# Patient Record
Sex: Male | Born: 1985 | Race: White | Hispanic: No | Marital: Single | Smoking: Current every day smoker
Health system: Southern US, Community
[De-identification: ages and names within clinical notes are randomized; demographics above are authoritative.]

---

## 2009-11-11 ENCOUNTER — Emergency Department (HOSPITAL_COMMUNITY): Admission: EM | Admit: 2009-11-11 | Discharge: 2009-11-11 | Payer: Self-pay | Admitting: Emergency Medicine

## 2009-12-16 ENCOUNTER — Emergency Department (HOSPITAL_COMMUNITY): Admission: EM | Admit: 2009-12-16 | Discharge: 2009-12-16 | Payer: Self-pay | Admitting: Family Medicine

## 2011-02-19 LAB — GC/CHLAMYDIA PROBE AMP, GENITAL

## 2011-02-19 LAB — RPR: RPR Ser Ql: NONREACTIVE

## 2014-03-26 ENCOUNTER — Ambulatory Visit: Payer: Self-pay | Admitting: Family Medicine

## 2014-03-26 VITALS — BP 114/72 | HR 88 | Temp 98.1°F | Resp 16 | Ht 64.0 in | Wt 178.4 lb

## 2014-03-26 DIAGNOSIS — I891 Lymphangitis: Secondary | ICD-10-CM

## 2014-03-26 DIAGNOSIS — N492 Inflammatory disorders of scrotum: Secondary | ICD-10-CM

## 2014-03-26 DIAGNOSIS — N498 Inflammatory disorders of other specified male genital organs: Secondary | ICD-10-CM

## 2014-03-26 DIAGNOSIS — Z7251 High risk heterosexual behavior: Secondary | ICD-10-CM

## 2014-03-26 MED ORDER — DOXYCYCLINE HYCLATE 100 MG PO TABS: 100.0000 mg | ORAL_TABLET | Freq: Two times a day (BID) | ORAL | Status: DC

## 2014-03-26 MED ORDER — CEFTRIAXONE SODIUM 1 G IJ SOLR
1.0000 g | Freq: Once | INTRAMUSCULAR | Status: AC
  Administered 2014-03-26: 1 g via INTRAMUSCULAR

## 2014-03-26 NOTE — Patient Instructions (Signed)
Warm soaks or hot compresses to swollen area, start antibiotic as discussed. Recheck with Dr. Neva SeatGreene in next 2 days. Return to the clinic or go to the nearest emergency room if any of your symptoms worsen or new symptoms occur. You should receive a call or letter about your lab results within the next week to 10 days.   Cellulitis Cellulitis is an infection of the skin and the tissue beneath it. The infected area is usually red and tender. Cellulitis occurs most often in the arms and lower legs.  CAUSES  Cellulitis is caused by bacteria that enter the skin through cracks or cuts in the skin. The most common types of bacteria that cause cellulitis are Staphylococcus and Streptococcus. SYMPTOMS   Redness and warmth.  Swelling.  Tenderness or pain.  Fever. DIAGNOSIS  Your caregiver can usually determine what is wrong based on a physical exam. Blood tests may also be done. TREATMENT  Treatment usually involves taking an antibiotic medicine. HOME CARE INSTRUCTIONS   Take your antibiotics as directed. Finish them even if you start to feel better.  Keep the infected arm or leg elevated to reduce swelling.  Apply a warm cloth to the affected area up to 4 times per day to relieve pain.  Only take over-the-counter or prescription medicines for pain, discomfort, or fever as directed by your caregiver.  Keep all follow-up appointments as directed by your caregiver. SEEK MEDICAL CARE IF:   You notice red streaks coming from the infected area.  Your red area gets larger or turns dark in color.  Your bone or joint underneath the infected area becomes painful after the skin has healed.  Your infection returns in the same area or another area.  You notice a swollen bump in the infected area.  You develop new symptoms. SEEK IMMEDIATE MEDICAL CARE IF:   You have a fever.  You feel very sleepy.  You develop vomiting or diarrhea.  You have a general ill feeling (malaise) with muscle  aches and pains. MAKE SURE YOU:   Understand these instructions.  Will watch your condition.  Will get help right away if you are not doing well or get worse. Document Released: 08/30/2005 Document Revised: 05/21/2012 Document Reviewed: 02/05/2012 [facility] Patient Information 2014 [place], [place].

## 2014-03-26 NOTE — Progress Notes (Addendum)
This chart was scribed for Kenneth IncorporatedJeffrey R. Neva SeatGreene, MD by Beverly MilchJ Harrison Collins, ED Scribe. This patient was seen in room 1 and the patient's care was started at 2:49 PM.  Subjective:    Patient ID: Kenneth Gaines, male    DOB: 01/06/1986, 28 y.o.   MRN: [id number]  Chief Complaint  Patient presents with  . Ingrown hair in private area x 4 days    HPI Kenneth Gaines is a 28 y.o. male who c/o an ingrown hair that he noticed 5 days ago. He states it's becoming increasingly swollen and sore to the touch. Pt states he punctured the area with a needle last night. He states he's applied neosporin. He reports that he regularly shaves all the way down to the skin once a week with a straight razor. He reports a h/o similar ingrown hairs but never this large. Pt denies fevers, chills, and dysuria. He reports some mild testicular and shaft pain and some spot on the top of his penis for a couple years. Pt confirms a h/o gonorrhea and chlymidia. He states he has been with around 10 sexual partners in the recent past but has been with his current partner for a year. He is only sexually active with females. He has never worn a condom with his current partner. He states he hasn't been tested for STIs in a few years. 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  No primary provider on file.  There are no active problems to display for this patient.  History reviewed. No pertinent past medical history. History reviewed. No pertinent past surgical history. No Known Allergies Prior to Admission medications   Not on File   History   Social History  . Marital Status: Single    Spouse Name: N/A    Number of Children: N/A  . Years of Education: N/A   Occupational History  . Not on file.   Social History Main Topics  . Smoking status: Current Every Day Smoker  . Smokeless tobacco: Not on file  . Alcohol Use: No  . Drug Use: No  . Sexual Activity: Yes   Other Topics Concern  . Not on file    Social History Narrative  . No narrative on file      Review of Systems  Constitutional: Negative for fever and chills.  Genitourinary: Positive for penile pain and testicular pain. Negative for dysuria.  Skin: Positive for rash.       Objective:   Physical Exam  Nursing note and vitals reviewed. Constitutional: He is oriented to person, place, and time. He appears well-developed and well-nourished.  HENT:  Head: Normocephalic and atraumatic.  Neck: Neck supple.  Pulmonary/Chest: Effort normal.  Genitourinary:  Firm nodular area at the base of the penis extending to the midline of the scrotum with erythema and a pustular area, no apparent fluctuance of the soft tissue in the area, testicles non tender.  Lymphadenopathy:       Right: Inguinal adenopathy present.       Left: Inguinal adenopathy present.  Neurological: He is alert and oriented to person, place, and time. No cranial nerve deficit.  Psychiatric: He has a normal mood and affect. His behavior is normal.    Filed Vitals:   03/26/14 1352  BP: 114/72  Pulse: 88  Temp: 98.1 F (36.7 C)  TempSrc: Oral  Resp: 16  Height: 5\' 4"  (1.626 m)  Weight: 178 lb 6.4 oz (80.922 kg)  SpO2: 97%       Assessment & Plan:   Kenneth EddyRichard Hook is a 28 y.o. male Cellulitis of scrotum, Lymphangitis - Plan: Wound culture, doxycycline (VIBRA-TABS) 100 MG tablet, cefTRIAXone (ROCEPHIN) injection 1 g  -appears to be spreading with lymphangitis. 2nd MD exam performed. cx obtained, start Rocephin, and doxycycline for MRSA coverage. Recheck in 48 hours. Sx care prior to that time.   Problems related to high-risk sexual behavior - Plan: GC/Chlamydia Probe Amp - discussed HIV and RPR - plans to have done at health dept. Small area of dry skin at distal penile shaft appears more irritated/abraded than infectious - advised condom use with lubricant and if not improved in next 2 weeks - recheck.  Sooner if worse.        Meds ordered this  encounter  Medications  . doxycycline (VIBRA-TABS) 100 MG tablet    Sig: Take 1 tablet (100 mg total) by mouth 2 (two) times daily.    Dispense:  20 tablet    Refill:  0  . cefTRIAXone (ROCEPHIN) injection 1 g    Sig:     Order Specific Question:  Antibiotic Indication:    Answer:  Cellulitis   Patient Instructions  Warm soaks or hot compresses to swollen area, start antibiotic as discussed. Recheck with Dr. Neva Gaines in next 2 days. Return to the clinic or go to the nearest emergency room if any of your symptoms worsen or new symptoms occur. You should receive a call or letter about your lab results within the next week to 10 days.   Cellulitis Cellulitis is an infection of the skin and the tissue beneath it. The infected area is usually red and tender. Cellulitis occurs most often in the arms and lower legs.  CAUSES  Cellulitis is caused by bacteria that enter the skin through cracks or cuts in the skin. The most common types of bacteria that cause cellulitis are Staphylococcus and Streptococcus. SYMPTOMS   Redness and warmth.  Swelling.  Tenderness or pain.  Fever. DIAGNOSIS  Your caregiver can usually determine what is wrong based on a physical exam. Blood tests may also be done. TREATMENT  Treatment usually involves taking an antibiotic medicine. HOME CARE INSTRUCTIONS   Take your antibiotics as directed. Finish them even if you start to feel better.  Keep the infected arm or leg elevated to reduce swelling.  Apply a warm cloth to the affected area up to 4 times per day to relieve pain.  Only take over-the-counter or prescription medicines for pain, discomfort, or fever as directed by your caregiver.  Keep all follow-up appointments as directed by your caregiver. SEEK MEDICAL CARE IF:   You notice red streaks coming from the infected area.  Your red area gets larger or turns dark in color.  Your bone or joint underneath the infected area becomes painful after the  skin has healed.  Your infection returns in the same area or another area.  You notice a swollen bump in the infected area.  You develop new symptoms. SEEK IMMEDIATE MEDICAL CARE IF:   You have a fever.  You feel very sleepy.  You develop vomiting or diarrhea.  You have a general ill feeling (malaise) with muscle aches and pains. MAKE SURE YOU:   Understand these instructions.  Will watch your condition.  Will get help right away if you are not doing well or get worse. Document Released: 08/30/2005 Document Revised: 05/21/2012 Document Reviewed: 02/05/2012 [facility] Patient Information 2014 [place]ExitCare, [place]LL[place].

## 2014-03-27 LAB — GC/CHLAMYDIA PROBE AMP
CT Probe RNA: NEGATIVE
GC Probe RNA: NEGATIVE

## 2014-03-29 LAB — WOUND CULTURE
GRAM STAIN: NONE SEEN
Gram Stain: NONE SEEN
ORGANISM ID, BACTERIA: NO GROWTH

## 2015-04-08 ENCOUNTER — Ambulatory Visit (INDEPENDENT_AMBULATORY_CARE_PROVIDER_SITE_OTHER): Payer: Self-pay | Admitting: Emergency Medicine

## 2015-04-08 VITALS — BP 114/70 | HR 84 | Temp 98.2°F | Resp 18 | Ht 64.5 in | Wt 173.6 lb

## 2015-04-08 DIAGNOSIS — Z021 Encounter for pre-employment examination: Secondary | ICD-10-CM

## 2015-04-08 DIAGNOSIS — Z029 Encounter for administrative examinations, unspecified: Secondary | ICD-10-CM

## 2015-04-08 NOTE — Progress Notes (Signed)
[facility] [address], [place] [address] [phone]  Date:  04/08/2015   Name:  Kenneth Gaines   DOB:  10/04/1986   MRN:  [id number]  PCP:  No primary care provider on file.    Chief Complaint: Employment Physical   History of Present Illness:  Kenneth Gaines is a 29 y.o. very pleasant male patient who presents with the following:  DOT  There are no active problems to display for this patient.   History reviewed. No pertinent past medical history.  History reviewed. No pertinent past surgical history.  History  Substance Use Topics  . Smoking status: Current Every Day Smoker  . Smokeless tobacco: Not on file  . Alcohol Use: No    Family History  Problem Relation Age of Onset  . Diabetes Mother   . Hypertension Mother   . Hypertension Brother     No Known Allergies  Medication list has been reviewed and updated.  No current outpatient prescriptions on file prior to visit.   No current facility-administered medications on file prior to visit.    Review of Systems:  Review of Systems  Constitutional: Negative for fever, chills and fatigue.  HENT: Negative for congestion, ear pain, hearing loss, postnasal drip, rhinorrhea and sinus pressure.   Eyes: Negative for discharge and redness.  Respiratory: Negative for cough, shortness of breath and wheezing.   Cardiovascular: Negative for chest pain and leg swelling.  Gastrointestinal: Negative for nausea, vomiting, abdominal pain, constipation and blood in stool.  Genitourinary: Negative for dysuria, urgency and frequency.  Musculoskeletal: Negative for neck stiffness.  Skin: Negative for rash.  Neurological: Negative for seizures, weakness and headaches.   Physical Examination: Filed Vitals:   04/08/15 1439  BP: 114/70  Pulse: 84  Temp: 98.2 F (36.8 C)  Resp: 18   Filed Vitals:   04/08/15 1439  Height: 5' 4.5" (1.638 m)  Weight: 173 lb 9.6 oz (78.744 kg)   Body mass index  is 29.35 kg/(m^2). Ideal Body Weight: Weight in (lb) to have BMI = 25: 147.6  GEN: WDWN, NAD, Non-toxic, A & O x 3 HEENT: Atraumatic, Normocephalic. Neck supple. No masses, No LAD. Ears and Nose: No external deformity. CV: RRR, No M/G/R. No JVD. No thrill. No extra heart sounds. PULM: CTA B, no wheezes, crackles, rhonchi. No retractions. No resp. distress. No accessory muscle use. ABD: S, NT, ND, +BS. No rebound. No HSM. EXTR: No c/c/e NEURO Normal gait.  PSYCH: Normally interactive. Conversant. Not depressed or anxious appearing.  Calm demeanor.    Assessment and Plan: DOT  Signed Phillips OdorJeffery Fisher Hargadon, MD

## 2017-07-03 ENCOUNTER — Encounter (HOSPITAL_COMMUNITY): Payer: Self-pay | Admitting: Emergency Medicine

## 2017-07-03 ENCOUNTER — Ambulatory Visit (INDEPENDENT_AMBULATORY_CARE_PROVIDER_SITE_OTHER): Payer: Self-pay

## 2017-07-03 ENCOUNTER — Ambulatory Visit (HOSPITAL_COMMUNITY)
Admission: EM | Admit: 2017-07-03 | Discharge: 2017-07-03 | Disposition: A | Discharge status: Home / Self Care | Payer: Self-pay | Attending: Emergency Medicine | Admitting: Emergency Medicine

## 2017-07-03 DIAGNOSIS — Y30XXXA Falling, jumping or pushed from a high place, undetermined intent, initial encounter: Secondary | ICD-10-CM

## 2017-07-03 DIAGNOSIS — M25571 Pain in right ankle and joints of right foot: Secondary | ICD-10-CM

## 2017-07-03 DIAGNOSIS — S82891A Other fracture of right lower leg, initial encounter for closed fracture: Secondary | ICD-10-CM

## 2017-07-03 MED ORDER — IBUPROFEN 600 MG PO TABS: 600.0000 mg | ORAL_TABLET | Freq: Four times a day (QID) | ORAL | 0 refills | Status: AC | PRN

## 2017-07-03 NOTE — Discharge Instructions (Signed)
600 mg ibuprofen with 1 g of Tylenol 3-4 times a day. Ice this for 20 minutes at a time. Elevate as much as possible. Do not weight bear on this ankle until you're seen by orthopedics. Call and make an appointment for as soon as you can.

## 2017-07-03 NOTE — ED Triage Notes (Signed)
Pt was jumping out of the back of his truck with tools in his hand when his pants got caught on something and he rolled his right ankle.  Pt has pain and swelling in the ankle.

## 2017-07-03 NOTE — Progress Notes (Signed)
Orthopedic Tech Progress Note Patient Details:  Kenneth EddyRichard Gaines 08/10/1986 [id number]  Ortho Devices Type of Ortho Device: Ace wrap, Crutches, Post (short leg) splint, Stirrup splint Ortho Device/Splint Location: rle Ortho Device/Splint Interventions: Application   Kenneth Gaines 07/03/2017, 3:13 PM

## 2017-07-03 NOTE — ED Provider Notes (Signed)
HPI  SUBJECTIVE:  Kenneth Gaines is a 31 y.o. male who presents with right lateral ankle pain, swelling after jumping off his truck approximately 3-1/2 feet. He states he landed on his foot and rolled his ankle outwards. States that he was wearing 60 pounds of tools as well. He reports sharp pain with walking and burning pain while sitting. He states that his whole ankle hurts. He reports numbness in his toes which have since resolved. No erythema, foot, proximal lower extremity, knee pain. No limitation of motion. He is unable to weight-bear immediately after. states that he can't take 4 steps here in the clinic. He has a history of right ankle sprain. He tried using a cane without relief. Symptoms are worse with walking, inversion/eversion. He is a smoker. No history of diabetes, hypertension. PMD: None.  History reviewed. No pertinent past medical history.  History reviewed. No pertinent surgical history.  Family History  Problem Relation Age of Onset  . Diabetes Mother   . Hypertension Mother   . Hypertension Brother     Social History  Substance Use Topics  . Smoking status: Current Every Day Smoker    Packs/day: 2.00    Types: Cigarettes  . Smokeless tobacco: Never Used  . Alcohol use Yes    No current facility-administered medications for this encounter.   Current Outpatient Prescriptions:  .  ibuprofen (ADVIL,MOTRIN) 600 MG tablet, Take 1 tablet (600 mg total) by mouth every 6 (six) hours as needed., Disp: 30 tablet, Rfl: 0  No Known Allergies   ROS  As noted in HPI.   Physical Exam  BP 129/82 (BP Location: Left Arm)   Pulse 96   Temp 98.9 F (37.2 C) (Oral)   SpO2 96%   Constitutional: Well developed, well nourished, no acute distress Eyes:  EOMI, conjunctiva normal bilaterally HENT: Normocephalic, atraumatic,mucus membranes moist Respiratory: Normal inspiratory effort Cardiovascular: Normal rate GI: nondistended skin: No rash, skin intact Musculoskeletal:  Large lateral soft tissue swelling right ankle, positive early bruising, Proximal fibula NT, Distal fibula tender, Medial malleolus NT,  Deltoid ligament medially NT ,  Lateral ligaments  tender, ATFL laterally tender, posterior tablofibular ligament laterally tender  , calcaneofibular ligament laterally  tender,  Achilles mildly tender, no soft tissue defects, Thompson test negative, calcaneus  NT,  Proximal 5th metatarsal NT, Midfoot NT, distal NVI with baseline sensation / motor to foot intact.Marland Kitchen. no pain with dorsiflexion/plantar flexion. Pain with inversion/eversion  Pt  not able to bear weight in dept.  Neurologic: Alert & oriented x 3, no focal neuro deficits Psychiatric: Speech and behavior appropriate   ED Course   Medications - No data to display  Orders Placed This Encounter  Procedures  . DG Ankle Complete Right    Standing Status:   Standing    Number of Occurrences:   1    Order Specific Question:   Reason for Exam (SYMPTOM  OR DIAGNOSIS REQUIRED)    Answer:   fall  . Apply other splint    Standing Status:   Standing    Number of Occurrences:   1    Order Specific Question:   Laterality    Answer:   Right    Order Specific Question:   Splint type    Answer:   posterior ankle and stirrup splint  . Crutches    Standing Status:   Standing    Number of Occurrences:   1    No results found for this or any previous  visit (from the past 24 hour(s)). Dg Ankle Complete Right  Result Date: 07/03/2017 CLINICAL DATA:  31 year old male status post fall from truck with ankle pain and swelling. EXAM: RIGHT ANKLE - COMPLETE 3+ VIEW COMPARISON:  None. FINDINGS: Anterior and lateral greater than medial soft tissue swelling about the right ankle. Multiple chronic appearing ossific fragments at both the medial and lateral malleolus. On the lateral view there are small more age indeterminate appearing ossific fragments in proximity to the posterior aspect of the lateral malleolus. No other  acute fracture of the distal tibia or fibula. Mortise joint alignment preserved. Talar dome intact. Calcaneus intact. Visible right foot osseous structures appear normal. IMPRESSION: Probable acute on chronic tiny avulsion fracture at the lateral malleolus. With underlying fairly extensive chronic posttraumatic ossific fragments at the medial and lateral malleolus. Electronically Signed   By: Odessa FlemingH  Hall M.D.   On: 07/03/2017 14:16    ED Clinical Impression  Closed avulsion fracture of right ankle, initial encounter   ED Assessment/Plan  Patient declined prescription of narcotics and he declined to get narcotics here.   Reviewed imaging independently.  Probable acute on chronic tiny avulsion fracture at the lateral malleolus. 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 See radiology report for full details.  Will have a posterior and ankle stirrup splint applied, crutches, nonweightbearing until follow-up with orthopedics in several days. [facility],  on-[facility].  Discussed imaging, MDM, plan and followup with patient. Discussed sn/sx that should prompt return to the ED. Patient agrees with plan.   Meds ordered this encounter  Medications  . ibuprofen (ADVIL,MOTRIN) 600 MG tablet    Sig: Take 1 tablet (600 mg total) by mouth every 6 (six) hours as needed.    Dispense:  30 tablet    Refill:  0    *This clinic note was created using Scientist, clinical (histocompatibility and immunogenetics)Dragon dictation software. Therefore, there may be occasional mistakes despite careful proofreading.  ?   Domenick GongMortenson, Marylee Belzer, MD 07/03/17 1719

## 2017-07-03 NOTE — ED Notes (Signed)
Ortho tech at bedside 

## 2017-11-16 ENCOUNTER — Encounter: Payer: Self-pay | Admitting: Urgent Care

## 2017-11-19 ENCOUNTER — Ambulatory Visit: Payer: Self-pay | Admitting: Physician Assistant

## 2017-11-19 ENCOUNTER — Other Ambulatory Visit: Payer: Self-pay

## 2017-11-19 VITALS — BP 137/85 | HR 97 | Temp 98.4°F | Resp 16 | Ht 65.35 in | Wt 190.0 lb

## 2017-11-19 DIAGNOSIS — Z0289 Encounter for other administrative examinations: Secondary | ICD-10-CM

## 2017-11-19 NOTE — Progress Notes (Signed)
[facility] [address], [place] [phone]  Date:  11/19/2017   Name:  Kenneth Gaines   DOB:  08/01/1986   MRN:  [id number]  PCP:  Patient, No Pcp Per    History of Present Illness:  Kenneth Gaines is a 31 y.o. male patient who presents to PCP with  Chief Complaint  Patient presents with  . Annual Exam     No complaints or concerns at this time.  There are no active problems to display for this patient.   No past medical history on file.  No past surgical history on file.  Social History   Tobacco Use  . Smoking status: Current Every Day Smoker    Packs/day: 2.00    Types: Cigarettes  . Smokeless tobacco: Never Used  Substance Use Topics  . Alcohol use: Yes  . Drug use: No    Family History  Problem Relation Age of Onset  . Diabetes Mother   . Hypertension Mother   . Hypertension Brother     No Known Allergies  Medication list has been reviewed and updated.  Current Outpatient Medications on File Prior to Visit  Medication Sig Dispense Refill  . ibuprofen (ADVIL,MOTRIN) 600 MG tablet Take 1 tablet (600 mg total) by mouth every 6 (six) hours as needed. (Patient not taking: Reported on 11/19/2017) 30 tablet 0   No current facility-administered medications on file prior to visit.     ROS ROS otherwise unremarkable unless listed above.  Physical Examination: BP 137/85   Pulse 97   Temp 98.4 F (36.9 C) (Oral)   Resp 16   Ht 5' 5.35" (1.66 m)   Wt 190 lb (86.2 kg)   SpO2 97%   BMI 31.28 kg/m  Ideal Body Weight: Weight in (lb) to have BMI = 25: 151.6  Physical Exam  Constitutional: He is oriented to person, place, and time. He appears well-developed and well-nourished. No distress.  HENT:  Head: Normocephalic and atraumatic.  Right Ear: Tympanic membrane, external ear and ear canal normal.  Left Ear: Tympanic membrane, external ear and ear canal normal.  Eyes: Conjunctivae and EOM are normal. Pupils are equal, round,  and reactive to light.  Cardiovascular: Normal rate and regular rhythm. Exam reveals no friction rub.  No murmur heard. Pulmonary/Chest: Effort normal. No respiratory distress. He has no wheezes.  Abdominal: Soft. Bowel sounds are normal. He exhibits no distension and no mass. There is no tenderness. Hernia confirmed negative in the right inguinal area and confirmed negative in the left inguinal area.  Musculoskeletal: Normal range of motion. He exhibits no edema or tenderness.  Neurological: He is alert and oriented to person, place, and time. He displays normal reflexes.  Skin: Skin is warm and dry. He is not diaphoretic.  Psychiatric: He has a normal mood and affect. His behavior is normal.    Assessment and Plan: Kenneth Gaines is a 31 y.o. male who is here today for cc of  Chief Complaint  Patient presents with  . Annual Exam   Encounter for examination required by Department of Transportation (DOT)  Trena PlattStephanie English, PA-C [facility] 12/29/20185:42 PM

## 2017-11-19 NOTE — Patient Instructions (Addendum)
Also contact [phone] now   --please contact me back if you would like to try any of these medicines, but we will need to follow up for additional refills Bupropion tablets (Depression/Mood Disorders) What is this medicine? BUPROPION (byoo PROE pee on) is used to treat depression. This medicine may be used for other purposes; ask your health care provider or pharmacist if you have questions. COMMON BRAND NAME(S): Wellbutrin What should I tell my health care provider before I take this medicine? They need to know if you have any of these conditions: -an eating disorder, such as anorexia or bulimia -bipolar disorder or psychosis -diabetes or high blood sugar, treated with medication -glaucoma -heart disease, previous heart attack, or irregular heart beat -head injury or brain tumor -high blood pressure -kidney or liver disease -seizures -suicidal thoughts or a previous suicide attempt -Tourette's syndrome -weight loss -an unusual or allergic reaction to bupropion, other medicines, foods, dyes, or preservatives -breast-feeding -pregnant or trying to become pregnant How should I use this medicine? Take this medicine by mouth with a glass of water. Follow the directions on the prescription label. You can take it with or without food. If it upsets your stomach, take it with food. Take your medicine at regular intervals. Do not take your medicine more often than directed. Do not stop taking this medicine suddenly except upon the advice of your doctor. Stopping this medicine too quickly may cause serious side effects or your condition may worsen. A special MedGuide will be given to you by the pharmacist with each prescription and refill. Be sure to read this information carefully each time. Talk to your pediatrician regarding the use of this medicine in children. Special care may be needed. Overdosage: If you think you have taken too much of this medicine contact a poison control center or  emergency room at once. NOTE: This medicine is only for you. Do not share this medicine with others. What if I miss a dose? If you miss a dose, take it as soon as you can. If it is less than four hours to your next dose, take only that dose and skip the missed dose. Do not take double or extra doses. What may interact with this medicine? Do not take this medicine with any of the following medications: -linezolid -MAOIs like Azilect, Carbex, Eldepryl, Marplan, Nardil, and Parnate -methylene blue (injected into a vein) -other medicines that contain bupropion like Zyban This medicine may also interact with the following medications: -alcohol -certain medicines for anxiety or sleep -certain medicines for blood pressure like metoprolol, propranolol -certain medicines for depression or psychotic disturbances -certain medicines for HIV or AIDS like efavirenz, lopinavir, nelfinavir, ritonavir -certain medicines for irregular heart beat like propafenone, flecainide -certain medicines for Parkinson's disease like amantadine, levodopa -certain medicines for seizures like carbamazepine, phenytoin, phenobarbital -cimetidine -clopidogrel -cyclophosphamide -digoxin -furazolidone -isoniazid -nicotine -orphenadrine -procarbazine -steroid medicines like prednisone or cortisone -stimulant medicines for attention disorders, weight loss, or to stay awake -tamoxifen -theophylline -thiotepa -ticlopidine -tramadol -warfarin This list may not describe all possible interactions. Give your health care provider a list of all the medicines, herbs, non-prescription drugs, or dietary supplements you use. Also tell them if you smoke, drink alcohol, or use illegal drugs. Some items may interact with your medicine. What should I watch for while using this medicine? Tell your doctor if your symptoms do not get better or if they get worse. Visit your doctor or health care professional for regular checks on your  progress.  Because it may take several weeks to see the full effects of this medicine, it is important to continue your treatment as prescribed by your doctor. Patients and their families should watch out for new or worsening thoughts of suicide or depression. Also watch out for sudden changes in feelings such as feeling anxious, agitated, panicky, irritable, hostile, aggressive, impulsive, severely restless, overly excited and hyperactive, or not being able to sleep. If this happens, especially at the beginning of treatment or after a change in dose, call your health care professional. Avoid alcoholic drinks while taking this medicine. Drinking excessive alcoholic beverages, using sleeping or anxiety medicines, or quickly stopping the use of these agents while taking this medicine may increase your risk for a seizure. Do not drive or use heavy machinery until you know how this medicine affects you. This medicine can impair your ability to perform these tasks. Do not take this medicine close to bedtime. It may prevent you from sleeping. Your mouth may get dry. Chewing sugarless gum or sucking hard candy, and drinking plenty of water may help. Contact your doctor if the problem does not go away or is severe. What side effects may I notice from receiving this medicine? Side effects that you should report to your doctor or health care professional as soon as possible: -allergic reactions like skin rash, itching or hives, swelling of the face, lips, or tongue -breathing problems -changes in vision -confusion -elevated mood, decreased need for sleep, racing thoughts, impulsive behavior -fast or irregular heartbeat -hallucinations, loss of contact with reality -increased blood pressure -redness, blistering, peeling or loosening of the skin, including inside the mouth -seizures -suicidal thoughts or other mood changes -unusually weak or tired -vomiting Side effects that usually do not require medical  attention (report to your doctor or health care professional if they continue or are bothersome): -constipation -headache -loss of appetite -nausea -tremors -weight loss This list may not describe all possible side effects. Call your doctor for medical advice about side effects. You may report side effects to FDA at 1-800-FDA-1088. Where should I keep my medicine? Keep out of the reach of children. Store at room temperature between 20 and 25 degrees C (68 and 77 degrees F), away from direct sunlight and moisture. Keep tightly closed. Throw away any unused medicine after the expiration date. NOTE: This sheet is a summary. It may not cover all possible information. If you have questions about this medicine, talk to your doctor, pharmacist, or health care provider.  2018 Elsevier/Gold Standard (2016-05-12 13:44:21)   Varenicline oral tablets What is this medicine? VARENICLINE (var EN i kleen) is used to help people quit smoking. It can reduce the symptoms caused by stopping smoking. It is used with a patient support program recommended by your physician. This medicine may be used for other purposes; ask your health care provider or pharmacist if you have questions. COMMON BRAND NAME(S): Chantix What should I tell my health care provider before I take this medicine? They need to know if you have any of these conditions: -bipolar disorder, depression, schizophrenia or other mental illness -heart disease -if you often drink alcohol -kidney disease -peripheral vascular disease -seizures -stroke -suicidal thoughts, plans, or attempt; a previous suicide attempt by you or a family member -an unusual or allergic reaction to varenicline, other medicines, foods, dyes, or preservatives -pregnant or trying to get pregnant -breast-feeding How should I use this medicine? Take this medicine by mouth after eating. Take with a full glass of water.  Follow the directions on the prescription label. Take  your doses at regular intervals. Do not take your medicine more often than directed. There are 3 ways you can use this medicine to help you quit smoking; talk to your health care professional to decide which plan is right for you: 1) you can choose a quit date and start this medicine 1 week before the quit date, or, 2) you can start taking this medicine before you choose a quit date, and then pick a quit date between day 8 and 35 days of treatment, or, 3) if you are not sure that you are able or willing to quit smoking right away, start taking this medicine and slowly decrease the amount you smoke as directed by your health care professional with the goal of being cigarette-free by week 12 of treatment. Stick to your plan; ask about support groups or other ways to help you remain cigarette-free. If you are motivated to quit smoking and did not succeed during a previous attempt with this medicine for reasons other than side effects, or if you returned to smoking after this treatment, speak with your health care professional about whether another course of this medicine may be right for you. A special MedGuide will be given to you by the pharmacist with each prescription and refill. Be sure to read this information carefully each time. Talk to your pediatrician regarding the use of this medicine in children. This medicine is not approved for use in children. Overdosage: If you think you have taken too much of this medicine contact a poison control center or emergency room at once. NOTE: This medicine is only for you. Do not share this medicine with others. What if I miss a dose? If you miss a dose, take it as soon as you can. If it is almost time for your next dose, take only that dose. Do not take double or extra doses. What may interact with this medicine? -alcohol or any product that contains alcohol -insulin -other stop smoking aids -theophylline -warfarin This list may not describe all possible  interactions. Give your health care provider a list of all the medicines, herbs, non-prescription drugs, or dietary supplements you use. Also tell them if you smoke, drink alcohol, or use illegal drugs. Some items may interact with your medicine. What should I watch for while using this medicine? Visit your doctor or health care professional for regular check ups. Ask for ongoing advice and encouragement from your doctor or healthcare professional, friends, and family to help you quit. If you smoke while on this medication, quit again Your mouth may get dry. Chewing sugarless gum or sucking hard candy, and drinking plenty of water may help. Contact your doctor if the problem does not go away or is severe. You may get drowsy or dizzy. Do not drive, use machinery, or do anything that needs mental alertness until you know how this medicine affects you. Do not stand or sit up quickly, especially if you are an older patient. This reduces the risk of dizzy or fainting spells. Sleepwalking can happen during treatment with this medicine, and can sometimes lead to behavior that is harmful to you, other people, or property. Stop taking this medicine and tell your doctor if you start sleepwalking or have other unusual sleep-related activity. Decrease the amount of alcoholic beverages that you drink during treatment with this medicine until you know if this medicine affects your ability to tolerate alcohol. Some people have experienced increased drunkenness (intoxication),  unusual or sometimes aggressive behavior, or no memory of things that have happened (amnesia) during treatment with this medicine. The use of this medicine may increase the chance of suicidal thoughts or actions. Pay special attention to how you are responding while on this medicine. Any worsening of mood, or thoughts of suicide or dying should be reported to your health care professional right away. What side effects may I notice from receiving this  medicine? Side effects that you should report to your doctor or health care professional as soon as possible: -allergic reactions like skin rash, itching or hives, swelling of the face, lips, tongue, or throat -acting aggressive, being angry or violent, or acting on dangerous impulses -breathing problems -changes in vision -chest pain or chest tightness -confusion, trouble speaking or understanding -new or worsening depression, anxiety, or panic attacks -extreme increase in activity and talking (mania) -fast, irregular heartbeat -feeling faint or lightheaded, falls -fever -pain in legs when walking -problems with balance, talking, walking -redness, blistering, peeling or loosening of the skin, including inside the mouth -ringing in ears -seeing or hearing things that aren't there (hallucinations) -seizures -sleepwalking -sudden numbness or weakness of the face, arm or leg -thoughts about suicide or dying, or attempts to commit suicide -trouble passing urine or change in the amount of urine -unusual bleeding or bruising -unusually weak or tired Side effects that usually do not require medical attention (report to your doctor or health care professional if they continue or are bothersome): -constipation -headache -nausea, vomiting -strange dreams -stomach gas -trouble sleeping This list may not describe all possible side effects. Call your doctor for medical advice about side effects. You may report side effects to FDA at 1-800-FDA-1088. Where should I keep my medicine? Keep out of the reach of children. Store at room temperature between 15 and 30 degrees C (59 and 86 degrees F). Throw away any unused medicine after the expiration date. NOTE: This sheet is a summary. It may not cover all possible information. If you have questions about this medicine, talk to your doctor, pharmacist, or health care provider.  2018 Elsevier/Gold Standard (2015-08-05 16:14:23)     IF you  received an x-ray today, [facility]. Please contact [facility] at [phone] with questions or concerns regarding your invoice.   IF you received labwork today, you will receive an invoice from [facility]. Please contact [facility] at [phone] with questions or concerns regarding your invoice.   Our billing staff will not be able to assist you with questions regarding bills from these companies.  You will be contacted with the lab results as soon as they are available. The fastest way to get your results is to activate your My Chart account. Instructions are located on the last page of this paperwork. If you have not heard from us regarding the results in 2 weeks, please contact this office.

## 2017-12-01 ENCOUNTER — Encounter: Payer: Self-pay | Admitting: Physician Assistant

## 2018-01-08 IMAGING — DX DG ANKLE COMPLETE 3+V*R*
3 series · 3 of 3 positions shown · non-contrast
Comparison: None.

CLINICAL DATA: 31-year-old male status post fall from truck with
ankle pain and swelling.

EXAM:
RIGHT ANKLE - COMPLETE 3+ VIEW

[ankle ap]
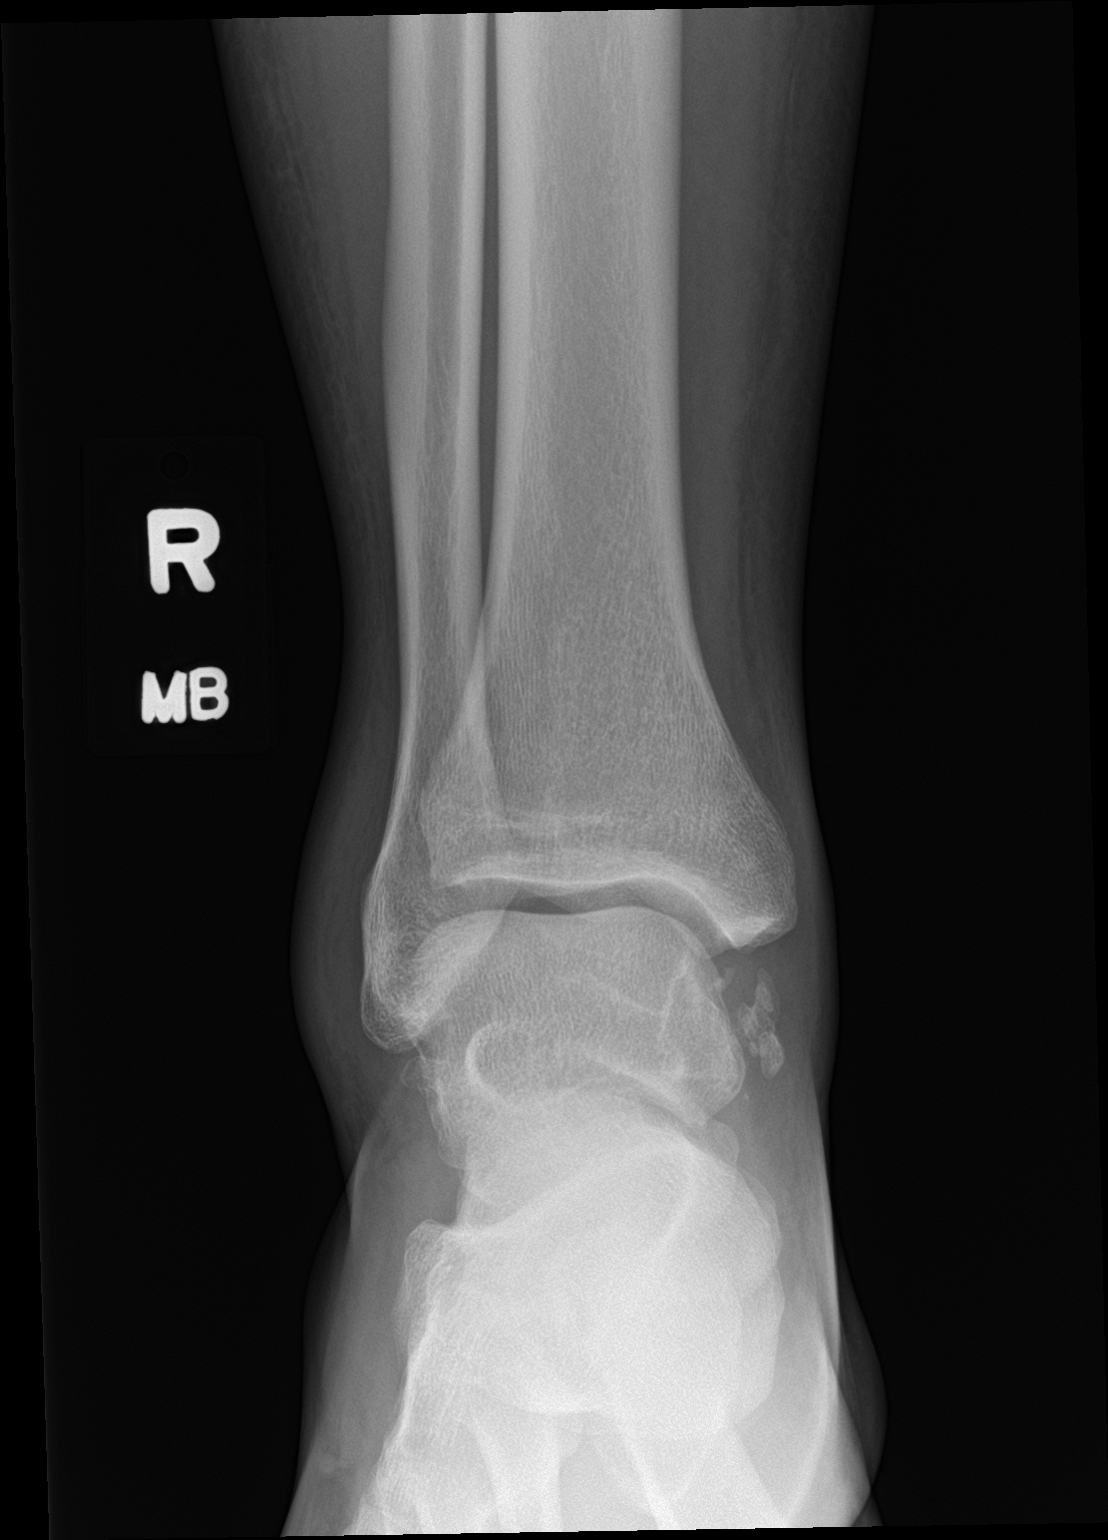

[ankle obl]
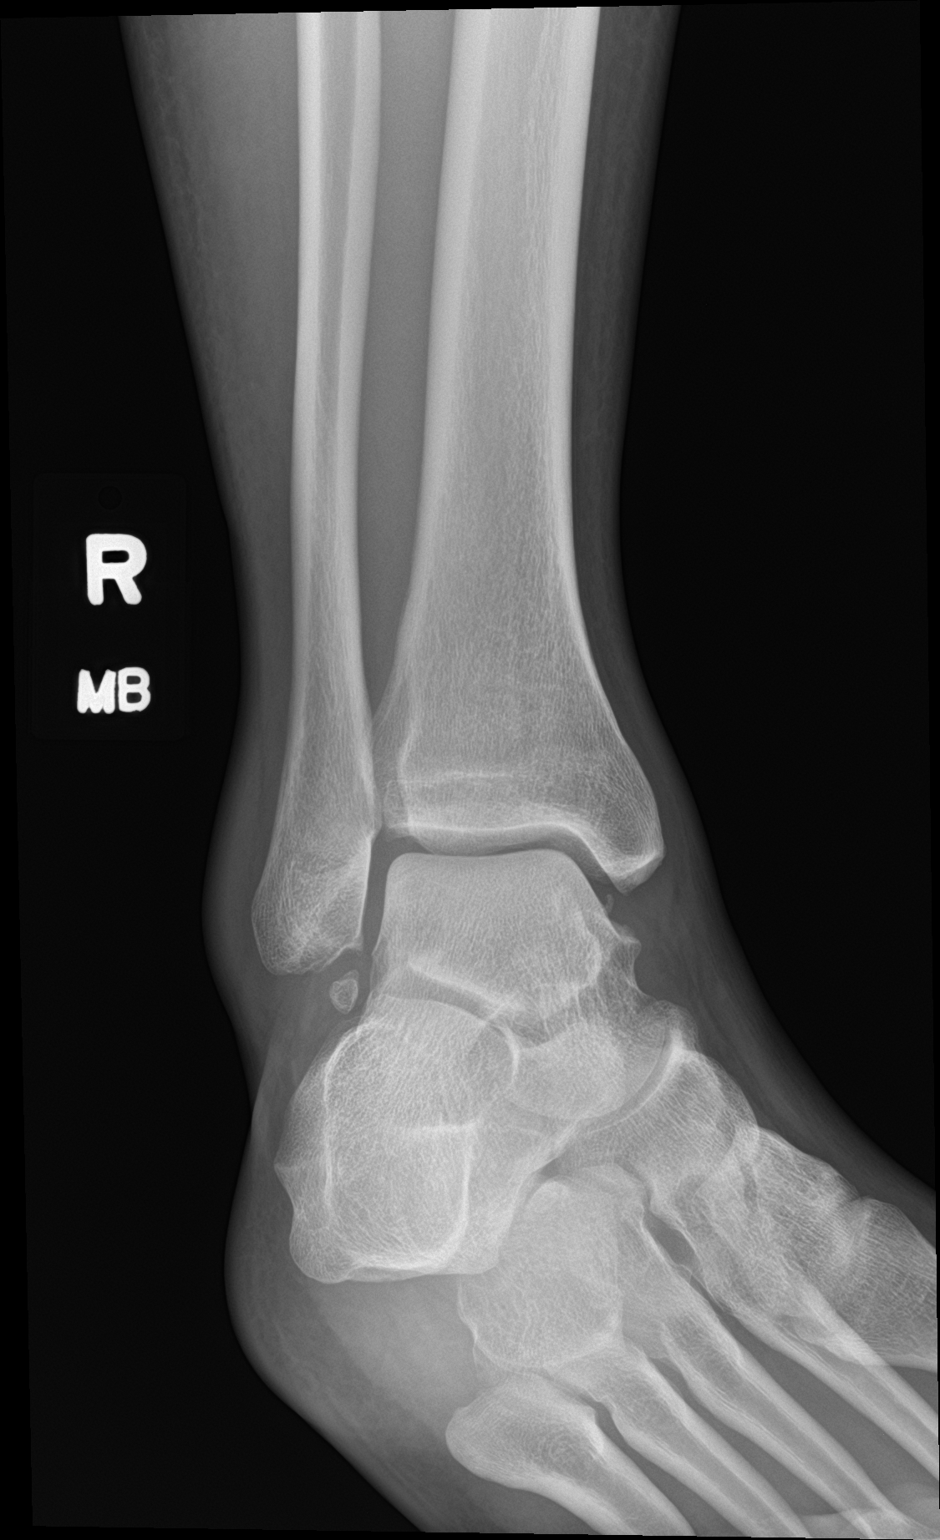

[ankle lat]
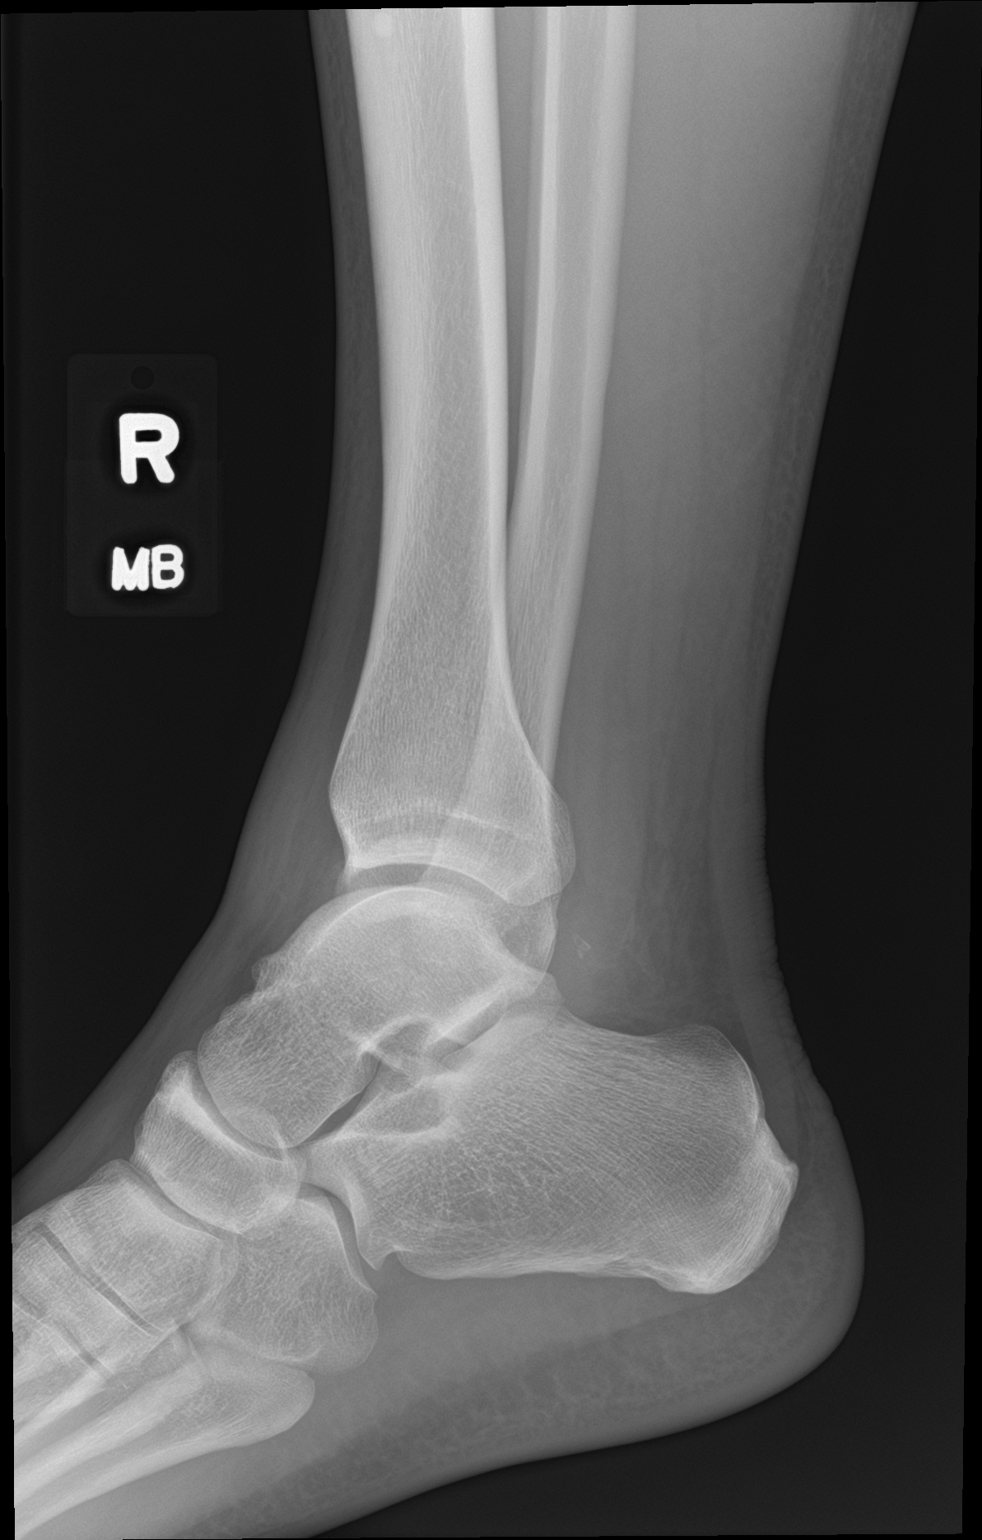

[3 of 3 positions shown; findings below may reference images not displayed]

FINDINGS: Anterior and lateral greater than medial soft tissue swelling about
the right ankle. Multiple chronic appearing ossific fragments at
both the medial and lateral malleolus. On the lateral view there are
small more age indeterminate appearing ossific fragments in
proximity to the posterior aspect of the lateral malleolus. No other
acute fracture of the distal tibia or fibula. Mortise joint
alignment preserved. Talar dome intact. Calcaneus intact. Visible
right foot osseous structures appear normal.
IMPRESSION: Probable acute on chronic tiny avulsion fracture at the lateral
malleolus. With underlying fairly extensive chronic posttraumatic
ossific fragments at the medial and lateral malleolus.

## 2018-03-04 ENCOUNTER — Encounter: Payer: Self-pay | Admitting: Physician Assistant
